# Patient Record
Sex: Male | Born: 1985
Health system: Southern US, Community
[De-identification: ages and names within clinical notes are randomized; demographics above are authoritative.]

## PROBLEM LIST (undated history)

## (undated) DIAGNOSIS — Z8619 Personal history of other infectious and parasitic diseases: Secondary | ICD-10-CM

## (undated) HISTORY — DX: Personal history of other infectious and parasitic diseases: Z86.19

---
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
## 2009-06-15 ENCOUNTER — Encounter: Admission: RE | Admit: 2009-06-15 | Discharge: 2009-06-15 | Payer: Self-pay | Admitting: Pulmonary Disease

## 2010-02-25 ENCOUNTER — Encounter: Admission: RE | Admit: 2010-02-25 | Discharge: 2010-02-25 | Payer: Self-pay | Admitting: Infectious Diseases

## 2012-12-17 ENCOUNTER — Ambulatory Visit
Admission: RE | Admit: 2012-12-17 | Discharge: 2012-12-17 | Disposition: A | Discharge status: Home / Self Care | Payer: No Typology Code available for payment source | Source: Ambulatory Visit | Attending: Occupational Medicine | Admitting: Occupational Medicine

## 2012-12-17 ENCOUNTER — Other Ambulatory Visit: Payer: Self-pay | Admitting: Occupational Medicine

## 2012-12-17 DIAGNOSIS — Z021 Encounter for pre-employment examination: Secondary | ICD-10-CM

## 2013-09-09 ENCOUNTER — Other Ambulatory Visit: Payer: Self-pay | Admitting: Occupational Medicine

## 2013-09-09 ENCOUNTER — Ambulatory Visit
Admission: RE | Admit: 2013-09-09 | Discharge: 2013-09-09 | Disposition: A | Discharge status: Home / Self Care | Payer: Worker's Compensation | Source: Ambulatory Visit | Attending: Occupational Medicine | Admitting: Occupational Medicine

## 2013-09-09 DIAGNOSIS — M25512 Pain in left shoulder: Secondary | ICD-10-CM

## 2019-03-10 ENCOUNTER — Telehealth: Payer: Self-pay | Admitting: Physician Assistant

## 2019-03-10 ENCOUNTER — Other Ambulatory Visit: Payer: Self-pay

## 2019-03-10 ENCOUNTER — Encounter: Payer: Self-pay | Admitting: Physician Assistant

## 2019-03-10 ENCOUNTER — Ambulatory Visit (INDEPENDENT_AMBULATORY_CARE_PROVIDER_SITE_OTHER): Payer: 59 | Admitting: Physician Assistant

## 2019-03-10 DIAGNOSIS — R1032 Left lower quadrant pain: Secondary | ICD-10-CM | POA: Diagnosis not present

## 2019-03-10 NOTE — Telephone Encounter (Signed)
Please call and schedule New Pt appt for today with [facility].

## 2019-03-10 NOTE — Telephone Encounter (Signed)
Pt is requesting NP appt. His wife sees [place] as well. Pt stated he has been having "dull pain in lower left abdom. " since Friday. No F/C/C per pt. Please advise if pt can come in office or do virtual.

## 2019-03-10 NOTE — Progress Notes (Signed)
Virtual Visit via Video   I connected with Todd Key on 03/10/19 at 11:20 AM EDT by a video enabled telemedicine application and verified that I am speaking with the correct person using two identifiers. Location patient: Home Location provider: [facility], Office Persons participating in the virtual visit: Todd BodyGradon Sgroi, Jarold MottoSamantha Niamh Rada, PA, Jarold MottoSamantha Keelee Yankey, PA-C.   I discussed the limitations of evaluation and management by telemedicine and the availability of in person appointments. The patient expressed understanding and agreed to proceed.  Subjective:   HPI:   Establish care -- Pt is a new patient today and establishing with our office.  LLQ pain Pt c/o LLQ dull pain, no radiation, started on Saturday. At its worse its 6/10. No fever, nausea, vomiting. Has had slightly decreased appetite. Pt states pain is worse when bladder is full and has to move bowels and after he goes pain subsides but does not go away completely. Pt denies diarrhea or constipation. Pt taking Tylenol 1000 mg with some relief. He does state that he has occasional rectal bleeding after straining too hard. States that he had slight blood on his toilet tissue when wiping on Thursday or Friday prior to onset of symptoms -- this is not completely uncommon for him.  Denies dysuria, hx of kidney stone, back pain, rectal pain, abdominal pain with movement, unintentional weight loss. Denies lactose intolerance. Blood pressure 130/80's when he checks it.  ROS: See pertinent positives and negatives per HPI.  There are no active problems to display for this patient.   Social History   Tobacco Use  . Smoking status: Never Smoker  . Smokeless tobacco: Never Used  Substance Use Topics  . Alcohol use: Yes    Alcohol/week: 3.0 standard drinks    Types: 3 Standard drinks or equivalent per week   No current outpatient medications on file.  Allergies  Allergen Reactions  . Oxycodone Nausea And Vomiting  .  Penicillins Rash    Objective:   VITALS: Per patient if applicable, see vitals. GENERAL: Alert, appears well and in no acute distress. HEENT: Atraumatic, conjunctiva clear, no obvious abnormalities on inspection of external nose and ears. NECK: Normal movements of the head and neck. CARDIOPULMONARY: No increased WOB. Speaking in clear sentences. I:E ratio WNL.  MS: Moves all visible extremities without noticeable abnormality. PSYCH: Pleasant and cooperative, well-groomed. Speech normal rate and rhythm. Affect is appropriate. Insight and judgement are appropriate. Attention is focused, linear, and appropriate.  NEURO: CN grossly intact. Oriented as arrived to appointment on time with no prompting. Moves both UE equally.  SKIN: No obvious lesions, wounds, erythema, or cyanosis noted on face or hands.  Assessment and Plan:   [name] was seen today for establish care and abdominal pain.  Diagnoses and all orders for this visit:  Left lower quadrant abdominal pain   No red flags on history. Denies fever, cough, chills -- will have patient come in KUB and UA. Suspect constipation, however cannot rule out: diverticulitis, muscle strain, kidney stone, colitis. Worsening precautions advised. Further work-up to be guided by KUB and UA results, which he is coming into the office tomorrow to have performed.   . Reviewed expectations re: course of current medical issues. . Discussed self-management of symptoms. . Outlined signs and symptoms indicating need for more acute intervention. . Patient verbalized understanding and all questions were answered. [name]. Health Maintenance issues including appropriate healthy diet, exercise, and smoking avoidance were discussed with patient. . See orders for this visit as  documented in the electronic medical record.  I discussed the assessment and treatment plan with the patient. The patient was provided an opportunity to ask questions and all were answered. The  patient agreed with the plan and demonstrated an understanding of the instructions.   The patient was advised to call back or seek an in-person evaluation if the symptoms worsen or if the condition fails to improve as anticipated.    [place], [place] 03/10/2019

## 2019-03-11 ENCOUNTER — Other Ambulatory Visit (INDEPENDENT_AMBULATORY_CARE_PROVIDER_SITE_OTHER): Payer: 59

## 2019-03-11 ENCOUNTER — Other Ambulatory Visit: Payer: Self-pay

## 2019-03-11 ENCOUNTER — Ambulatory Visit (INDEPENDENT_AMBULATORY_CARE_PROVIDER_SITE_OTHER): Payer: 59

## 2019-03-11 DIAGNOSIS — R1032 Left lower quadrant pain: Secondary | ICD-10-CM

## 2019-03-11 LAB — POC URINALSYSI DIPSTICK (AUTOMATED)
Bilirubin, UA: NEGATIVE
Blood, UA: NEGATIVE
Glucose, UA: NEGATIVE
Ketones, UA: NEGATIVE
Leukocytes, UA: NEGATIVE
Nitrite, UA: NEGATIVE
Protein, UA: NEGATIVE
Spec Grav, UA: 1.01 (ref 1.010–1.025)
Urobilinogen, UA: 0.2 E.U./dL
pH, UA: 6 (ref 5.0–8.0)

## 2019-04-29 ENCOUNTER — Encounter: Payer: Self-pay | Admitting: Physician Assistant

## 2019-04-29 ENCOUNTER — Telehealth: Payer: Self-pay | Admitting: Physical Therapy

## 2019-04-29 NOTE — Telephone Encounter (Signed)
Copied from [place] [id number]. Topic: Quick Communication - See Telephone Encounter >> Apr 28, 2019  5:34 PM Loma Boston wrote: CRM for notification. See Telephone encounter for: 04/28/19. Pls reach out to pt. This is after hous. He is returning a call from office canceling his appt since it had not been 10 days since COVID symptoms. He wants to resch and really just needs blood drawn and a cancer screening. FU at # [phone]

## 2019-04-29 NOTE — Telephone Encounter (Signed)
Spoke to pt, scheduled appt for 6/30 wit Samantha.

## 2019-05-07 ENCOUNTER — Encounter: Payer: Self-pay | Admitting: Physician Assistant

## 2019-05-13 ENCOUNTER — Other Ambulatory Visit: Payer: Self-pay

## 2019-05-13 ENCOUNTER — Encounter: Payer: Self-pay | Admitting: Physician Assistant

## 2019-05-13 ENCOUNTER — Ambulatory Visit: Payer: 59 | Admitting: Physician Assistant

## 2019-05-13 VITALS — BP 122/64 | HR 65 | Temp 98.7°F | Ht 73.0 in | Wt 232.0 lb

## 2019-05-13 DIAGNOSIS — Z13220 Encounter for screening for lipoid disorders: Secondary | ICD-10-CM

## 2019-05-13 DIAGNOSIS — K625 Hemorrhage of anus and rectum: Secondary | ICD-10-CM | POA: Diagnosis not present

## 2019-05-13 DIAGNOSIS — E669 Obesity, unspecified: Secondary | ICD-10-CM

## 2019-05-13 DIAGNOSIS — Z129 Encounter for screening for malignant neoplasm, site unspecified: Secondary | ICD-10-CM

## 2019-05-13 DIAGNOSIS — Z136 Encounter for screening for cardiovascular disorders: Secondary | ICD-10-CM

## 2019-05-13 DIAGNOSIS — Z0001 Encounter for general adult medical examination with abnormal findings: Secondary | ICD-10-CM

## 2019-05-13 LAB — CBC WITH DIFFERENTIAL/PLATELET
Basophils Absolute: 0 10*3/uL (ref 0.0–0.1)
Basophils Relative: 0.5 % (ref 0.0–3.0)
Eosinophils Absolute: 0.1 10*3/uL (ref 0.0–0.7)
Eosinophils Relative: 0.9 % (ref 0.0–5.0)
HCT: 48.6 % (ref 39.0–52.0)
Hemoglobin: 16.7 g/dL (ref 13.0–17.0)
Lymphocytes Relative: 36 % (ref 12.0–46.0)
Lymphs Abs: 2.2 10*3/uL (ref 0.7–4.0)
MCHC: 34.3 g/dL (ref 30.0–36.0)
MCV: 84.7 fl (ref 78.0–100.0)
Monocytes Absolute: 0.4 10*3/uL (ref 0.1–1.0)
Monocytes Relative: 7 % (ref 3.0–12.0)
Neutro Abs: 3.4 10*3/uL (ref 1.4–7.7)
Neutrophils Relative %: 55.6 % (ref 43.0–77.0)
Platelets: 221 10*3/uL (ref 150.0–400.0)
RBC: 5.74 Mil/uL (ref 4.22–5.81)
RDW: 13.8 % (ref 11.5–15.5)
WBC: 6.1 10*3/uL (ref 4.0–10.5)

## 2019-05-13 LAB — LIPID PANEL
Cholesterol: 166 mg/dL (ref 0–200)
HDL: 39.5 mg/dL (ref >39.00)
LDL Cholesterol: 107 mg/dL — ABNORMAL HIGH (ref 0–99)
NonHDL: 126.67
Total CHOL/HDL Ratio: 4
Triglycerides: 97 mg/dL (ref 0.0–149.0)
VLDL: 19.4 mg/dL (ref 0.0–40.0)

## 2019-05-13 LAB — COMPREHENSIVE METABOLIC PANEL
ALT: 17 U/L (ref 0–53)
AST: 14 U/L (ref 0–37)
Albumin: 4.6 g/dL (ref 3.5–5.2)
Alkaline Phosphatase: 72 U/L (ref 39–117)
BUN: 14 mg/dL (ref 6–23)
CO2: 29 mEq/L (ref 19–32)
Calcium: 9.3 mg/dL (ref 8.4–10.5)
Chloride: 101 mEq/L (ref 96–112)
Creatinine, Ser: 1.01 mg/dL (ref 0.40–1.50)
GFR: 85.75 mL/min (ref >60.00)
Glucose, Bld: 69 mg/dL — ABNORMAL LOW (ref 70–99)
Potassium: 4.3 mEq/L (ref 3.5–5.1)
Sodium: 139 mEq/L (ref 135–145)
Total Bilirubin: 1.4 mg/dL — ABNORMAL HIGH (ref 0.2–1.2)
Total Protein: 6.6 g/dL (ref 6.0–8.3)

## 2019-05-13 NOTE — Progress Notes (Signed)
Subjective:    Todd Key is a 33 y.o. male and is here for a comprehensive physical exam.  HPI  There are no preventive care reminders to display for this patient.  Acute Concerns: Encounter for cancer screening -- patient reports that he is concerned that he is high-risk for developing cancer. 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 He has MGF with stomach CA, PGF with lung CA, and paternal aunt with early death at age 32 -- had "litany of health issues" Rectal bleeding -- has history of rectal bleeding intermittently over the past few years. Blood when wiping only. Last happened about two years ago. Denies unusual abdominal pain, unintentional weight loss, changes in bowels.  Chronic Issues: Obesity -- has lost about 10 lb (intentionally) with intermittent fasting. Exercises when he has time.   Health Maintenance: Immunizations -- UTD Colonoscopy -- n/a PSA -- denies family history Diet -- intermittent fasting, eats all food groups Caffeine intake -- none Sleep habits -- sometimes is a night owl Exercise -- as able Weight -- Weight: 232 lb (105.2 kg)  Weight history Wt Readings from Last 10 Encounters:  05/13/19 232 lb (105.2 kg)  Mood -- good Tobacco use -- none Alcohol use --- very minimal  [facility]  Decreased Interest 0  Down, Depressed, Hopeless 0  PHQ - 2 Score 0     Other providers/specialists: Patient Care Team: Todd Key, Todd Key, [place]PA as PCP - General (Physician Assistant)   PMHx, SurgHx, SocialHx, Medications, and Allergies were reviewed in the Visit Navigator and updated as appropriate.   Past Medical History:  Diagnosis Date  . History of chicken pox      Past Surgical History:  Procedure Laterality Date  . wisdom teeth Bilateral 2005     Family History  Problem Relation Age of Onset  . Alzheimer's disease Maternal Grandmother   . Stomach cancer  Maternal Grandfather   . Heart failure Maternal Grandfather   . Lung cancer Paternal Grandfather     Social History   Tobacco Use  . Smoking status: Never Smoker  . Smokeless tobacco: Never Used  Substance Use Topics  . Alcohol use: Yes    Alcohol/week: 3.0 standard drinks    Types: 3 Standard drinks or equivalent per week  . Drug use: Never    Review of Systems:   Review of Systems  Constitutional: Negative for chills, fever, malaise/fatigue and weight loss.  HENT: Negative for hearing loss, sinus pain and sore throat.   Respiratory: Negative for cough and hemoptysis.   Cardiovascular: Negative for chest pain, palpitations, leg swelling and PND.  Gastrointestinal: Positive for blood in stool. Negative for abdominal pain, constipation, diarrhea, heartburn, nausea and vomiting.  Genitourinary: Negative for dysuria, frequency and urgency.  Musculoskeletal: Negative for back pain, myalgias and neck pain.  Skin: Negative for itching and rash.  Neurological: Negative for dizziness, tingling, seizures and headaches.  Endo/Heme/Allergies: Negative for polydipsia.  Psychiatric/Behavioral: Negative for depression. The patient is not nervous/anxious.     Objective:   Vitals:   05/13/19 0918  BP: 122/64  Pulse: 65  Temp: 98.7 F (37.1 C)  SpO2: 99%   Body mass index is 30.61 kg/m.  General Appearance:  Alert, cooperative, no distress, appears stated age  Head:  Normocephalic, without obvious abnormality, atraumatic  Eyes:  PERRL, conjunctiva/corneas clear, EOM's intact, fundi benign, both eyes  Ears:  Normal TM's and external ear canals, both ears  Nose: Nares normal, septum midline, mucosa normal, no drainage    or sinus tenderness  Throat: Lips, mucosa, and tongue normal; teeth and gums normal  Neck: Supple, symmetrical, trachea midline, no adenopathy; thyroid:  No enlargement/tenderness/nodules; no carotit bruit or JVD  Back:   Symmetric, no curvature, ROM normal, no  CVA tenderness  Lungs:   Clear to auscultation bilaterally, respirations unlabored  Chest wall:  No tenderness or deformity  Heart:  Regular rate and rhythm, S1 and S2 normal, no murmur, rub   or gallop  Abdomen:   Soft, non-tender, bowel sounds active all four quadrants, no masses, no organomegaly  Extremities: Extremities normal, atraumatic, no cyanosis or edema  Prostate: Not done.   Skin: Skin color, texture, turgor normal, no rashes or lesions  Lymph nodes: Cervical, supraclavicular, and axillary nodes normal  Neurologic: CNII-XII grossly intact. Normal strength, sensation and reflexes throughout    Assessment/Plan:   Todd Key was seen today for requesting labs for cancer screening.  Diagnoses and all orders for this visit:  Encounter for general adult medical examination with abnormal findings Today patient counseled on age appropriate routine health concerns for screening and prevention, each reviewed and up to date or declined. Immunizations reviewed and up to date or declined. Labs ordered and reviewed. Risk factors for depression reviewed and negative. Hearing function and visual acuity are intact. ADLs screened and addressed as needed. Functional ability and level of safety reviewed and appropriate. Education, counseling and referrals performed based on assessed risks today. Patient provided with a copy of personalized plan for preventive services.  Rectal bleeding Discussed that it is likely from hemorrhoids, however cannot r/o other causes. Advised on GI referral, he would like to think about this and will let us know.  Obesity, unspecified classification, unspecified obesity type, unspecified whether serious comorbidity present Doing well with intermittent fasting. Commended success, continue efforts. -     CBC with Differential/Platelet -     Comprehensive metabolic panel  Encounter for lipid screening for cardiovascular disease -     Lipid panel  Encounter for cancer  screening Discussed extensively. I discussed possible consideration of referral to the [employer], however he would like to discuss this with his wife and then he will let us know.  Well Adult Exam: Labs ordered: Yes. Patient counseling was done. See below for items discussed. Discussed the patient's BMI.  The BMI is not in the acceptable range; BMI management plan is completed Follow up in one year.  Patient Counseling: [x]   Nutrition: Stressed importance of moderation in sodium/caffeine intake, saturated fat and cholesterol, caloric balance, sufficient intake of fresh fruits, vegetables, and fiber.  [x]   Stressed the importance of regular exercise.   []   Substance Abuse: Discussed cessation/primary prevention of tobacco, alcohol, or other drug use; driving or other dangerous activities under the influence; availability of treatment for abuse.   [x]   Injury prevention: Discussed safety belts, safety helmets, smoke detector, smoking near bedding or upholstery.   []   Sexuality: Discussed sexually transmitted diseases, partner selection, use of condoms, avoidance of unintended pregnancy  and contraceptive alternatives.   [x]   Dental health: Discussed importance of regular tooth brushing, flossing, and dental visits.  [x]   Health maintenance and immunizations reviewed. Please refer to Health maintenance section.     Inda Coke, PA-C [place]

## 2019-05-13 NOTE — Patient Instructions (Signed)
It was great to see you!  Please go to the lab for blood work.   Things to think about: 1. Referral to gastroenterologist for rectal bleeding 2. Referral to dermatology for skin checks (some insurances don't require referrals) 3. Referral to Genetic Counselor  Our office will call you with your results unless you have chosen to receive results via MyChart.  If your blood work is normal we will follow-up each year for physicals and as scheduled for chronic medical problems.  If anything is abnormal we will treat accordingly and get you in for a follow-up.  Take care,  [facility] Maintenance, Male Adopting a healthy lifestyle and getting preventive care are important in promoting health and wellness. Ask your health care provider about:  The right schedule for you to have regular tests and exams.  Things you can do on your own to prevent diseases and keep yourself healthy. What should I know about diet, weight, and exercise? Eat a healthy diet   Eat a diet that includes plenty of vegetables, fruits, low-fat dairy products, and lean protein.  Do not eat a lot of foods that are high in solid fats, added sugars, or sodium. Maintain a healthy weight Body mass index (BMI) is a measurement that can be used to identify possible weight problems. It estimates body fat based on height and weight. Your health care provider can help determine your BMI and help you achieve or maintain a healthy weight. Get regular exercise Get regular exercise. This is one of the most important things you can do for your health. Most adults should:  Exercise for at least 150 minutes each week. The exercise should increase your heart rate and make you sweat (moderate-intensity exercise).  Do strengthening exercises at least twice a week. This is in addition to the moderate-intensity exercise.  Spend less time sitting. Even light physical activity can be beneficial. Watch cholesterol and blood  lipids Have your blood tested for lipids and cholesterol at 32 years of age, then have this test every 5 years. You may need to have your cholesterol levels checked more often if:  Your lipid or cholesterol levels are high.  You are older than 32 years of age.  You are at high risk for heart disease. What should I know about cancer screening? Many types of cancers can be detected early and may often be prevented. Depending on your health history and family history, you may need to have cancer screening at various ages. This may include screening for:  Colorectal cancer.  Prostate cancer.  Skin cancer.  Lung cancer. What should I know about heart disease, diabetes, and high blood pressure? Blood pressure and heart disease  High blood pressure causes heart disease and increases the risk of stroke. This is more likely to develop in people who have high blood pressure readings, are of African descent, or are overweight.  Talk with your health care provider about your target blood pressure readings.  Have your blood pressure checked: ? Every 3-5 years if you are 78-24 years of age. ? Every year if you are 81 years old or older.  If you are between the ages of 36 and 76 and are a current or former smoker, ask your health care provider if you should have a one-time screening for abdominal aortic aneurysm (AAA). Diabetes Have regular diabetes screenings. This checks your fasting blood sugar level. Have the screening done:  Once every three years after age 25 if you  are at a normal weight and have a low risk for diabetes.  More often and at a younger age if you are overweight or have a high risk for diabetes. What should I know about preventing infection? Hepatitis B If you have a higher risk for hepatitis B, you should be screened for this virus. Talk with your health care provider to find out if you are at risk for hepatitis B infection. Hepatitis C Blood testing is recommended for:   Everyone born from [birth year] through 1964.  Anyone with known risk factors for hepatitis C. Sexually transmitted infections (STIs)  You should be screened each year for STIs, including gonorrhea and chlamydia, if: ? You are sexually active and are younger than 32 years of age. ? You are older than 32 years of age and your health care provider tells you that you are at risk for this type of infection. ? Your sexual activity has changed since you were last screened, and you are at increased risk for chlamydia or gonorrhea. Ask your health care provider if you are at risk.  Ask your health care provider about whether you are at high risk for HIV. Your health care provider may recommend a prescription medicine to help prevent HIV infection. If you choose to take medicine to prevent HIV, you should first get tested for HIV. You should then be tested every 3 months for as long as you are taking the medicine. Follow these instructions at home: Lifestyle  Do not use any products that contain nicotine or tobacco, such as cigarettes, e-cigarettes, and chewing tobacco. If you need help quitting, ask your health care provider.  Do not use street drugs.  Do not share needles.  Ask your health care provider for help if you need support or information about quitting drugs. Alcohol use  Do not drink alcohol if your health care provider tells you not to drink.  If you drink alcohol: ? Limit how much you have to 0-2 drinks a day. ? Be aware of how much alcohol is in your drink. In the [place] one drink equals one 12 oz bottle of beer (355 mL), one 5 oz glass of wine (148 mL), or one 1 oz glass of hard liquor (44 mL). General instructions  Schedule regular health, dental, and eye exams.  Stay current with your vaccines.  Tell your health care provider if: ? You often feel depressed. ? You have ever been abused or do not feel safe at home. Summary  Adopting a healthy lifestyle and getting preventive  care are important in promoting health and wellness.  Follow your health care provider's instructions about healthy diet, exercising, and getting tested or screened for diseases.  Follow your health care provider's instructions on monitoring your cholesterol and blood pressure. This information is not intended to replace advice given to you by your health care provider. Make sure you discuss any questions you have with your health care provider. Document Released: 04/27/2008 Document Revised: 10/23/2018 Document Reviewed: 10/23/2018 Elsevier Patient Education  2020 [employer]

## 2019-05-15 ENCOUNTER — Telehealth: Payer: Self-pay | Admitting: Physician Assistant

## 2019-05-15 DIAGNOSIS — R17 Unspecified jaundice: Secondary | ICD-10-CM

## 2019-05-15 NOTE — Telephone Encounter (Signed)
Patient is calling back regarding lab results. Please advise. Thank you

## 2019-05-19 NOTE — Telephone Encounter (Signed)
Pt calling back and would like a call back the nurse. Please advise   Copied from [place] [id number]. Topic: Quick Communication - Lab Results (Clinic Use ONLY) >> May 15, 2019  5:37 PM Zellmer, Clearnce Sorrel, CMA wrote: Called patient to inform them of 6/30 lab results. When patient returns call, triage nurse may disclose results.

## 2019-05-19 NOTE — Telephone Encounter (Signed)
See note

## 2019-05-20 NOTE — Telephone Encounter (Signed)
Lab orders are placed in Epic.

## 2019-05-20 NOTE — Telephone Encounter (Signed)
Please schedule lab appointment.

## 2019-05-20 NOTE — Telephone Encounter (Signed)
No lab orders in

## 2019-05-20 NOTE — Telephone Encounter (Signed)
Pt given results and documented in result note. Pt will need to be scheduled for recheck of labs in one month per notes of Samantha,PA on 05/14/19.

## 2019-05-22 ENCOUNTER — Other Ambulatory Visit: Payer: Self-pay

## 2019-05-22 ENCOUNTER — Other Ambulatory Visit (INDEPENDENT_AMBULATORY_CARE_PROVIDER_SITE_OTHER): Payer: 59

## 2019-05-22 DIAGNOSIS — R17 Unspecified jaundice: Secondary | ICD-10-CM | POA: Diagnosis not present

## 2019-05-22 LAB — HEPATIC FUNCTION PANEL
ALT: 17 U/L (ref 0–53)
AST: 14 U/L (ref 0–37)
Albumin: 4.6 g/dL (ref 3.5–5.2)
Alkaline Phosphatase: 71 U/L (ref 39–117)
Bilirubin, Direct: 0.2 mg/dL (ref 0.0–0.3)
Total Bilirubin: 1.3 mg/dL — ABNORMAL HIGH (ref 0.2–1.2)
Total Protein: 6.5 g/dL (ref 6.0–8.3)

## 2019-07-22 ENCOUNTER — Encounter: Payer: Self-pay | Admitting: Physician Assistant

## 2019-07-22 NOTE — Telephone Encounter (Signed)
Todd Key I do not see a referral.

## 2020-01-05 ENCOUNTER — Encounter: Payer: Self-pay | Admitting: Physician Assistant

## 2020-01-09 ENCOUNTER — Encounter: Payer: Self-pay | Admitting: Physician Assistant

## 2020-01-09 ENCOUNTER — Ambulatory Visit (INDEPENDENT_AMBULATORY_CARE_PROVIDER_SITE_OTHER): Payer: 59

## 2020-01-09 ENCOUNTER — Ambulatory Visit (INDEPENDENT_AMBULATORY_CARE_PROVIDER_SITE_OTHER): Payer: 59 | Admitting: Physician Assistant

## 2020-01-09 ENCOUNTER — Other Ambulatory Visit: Payer: Self-pay

## 2020-01-09 VITALS — BP 110/70 | HR 72 | Temp 98.0°F | Ht 73.0 in | Wt 236.5 lb

## 2020-01-09 DIAGNOSIS — M79622 Pain in left upper arm: Secondary | ICD-10-CM

## 2020-01-09 NOTE — Patient Instructions (Signed)
It was great to see you!  We will be in touch as soon as xrays have returned.  If you develop any new sudden onset weakness or other concerns, please seek urgent care.  A referral has been placed for you to see Dr. Clementeen Graham with [facility]. Someone from there office will be in touch soon regarding your appointment with him. His location: Psychiatrist Medicine at [facility] [address] on the 1st floor.   Phone number [phone], Fax [phone].  This location is across the street from the entrance to [employer] and in the same complex as the [facility] and [employer] bank  Take care,  Jarold Motto PA-C

## 2020-01-09 NOTE — Progress Notes (Signed)
Todd Key is a 34 y.o. male here for a new problem.  I acted as a Education administrator for [employer], PA-C Todd Pickler, LPN  History of Present Illness:   Chief Complaint  Patient presents with  . axilla pain    HPI   Axilla pain Pt c/o left axilla pain, started a couple weeks ago. Pain is off and on, has sharp pain when he brings his arm close to his body. Occurs about twice a day and varies in intensity, feels like it may happen more frequently at night.  Lasts 15-45 seconds. Rates it 6/10. Denies: numbness, tingling, weakness, prior significant neck/back injury (was active in wrestling several years ago.)  Had two people in his life recently pass away, does have this on his mind recently.   Past Medical History:  Diagnosis Date  . History of chicken pox      Social History   Socioeconomic History  . Marital status: Unknown    Spouse name: Not on file  . Number of children: Not on file  . Years of education: Not on file  . Highest education level: Not on file  Occupational History  . Not on file  Tobacco Use  . Smoking status: Never Smoker  . Smokeless tobacco: Never Used  Substance and Sexual Activity  . Alcohol use: Yes    Alcohol/week: 3.0 standard drinks    Types: 3 Standard drinks or equivalent per week  . Drug use: Never  . Sexual activity: Yes  Other Topics Concern  . Not on file  Social History Narrative   Airline pilot   Social Determinants of Health   Financial Resource Strain:   . Difficulty of Paying Living Expenses: Not on file  Food Insecurity:   . Worried About Charity fundraiser in the Last Year: Not on file  . Ran Out of Food in the Last Year: Not on file  Transportation Needs:   . Lack of Transportation (Medical): Not on file  . Lack of Transportation (Non-Medical): Not on file  Physical Activity:   . Days of Exercise per Week: Not on file  . Minutes of Exercise per Session: Not on file  Stress:   . Feeling of Stress : Not on file   Social Connections:   . Frequency of Communication with Friends and Family: Not on file  . Frequency of Social Gatherings with Friends and Family: Not on file  . Attends Religious Services: Not on file  . Active Member of Clubs or Organizations: Not on file  . Attends Archivist Meetings: Not on file  . Marital Status: Not on file  Intimate Partner Violence:   . Fear of Current or Ex-Partner: Not on file  . Emotionally Abused: Not on file  . Physically Abused: Not on file  . Sexually Abused: Not on file    Past Surgical History:  Procedure Laterality Date  . wisdom teeth Bilateral 2005    Family History  Problem Relation Age of Onset  . Alzheimer's disease Maternal Grandmother   . Stomach cancer Maternal Grandfather   . Heart failure Maternal Grandfather   . Lung cancer Paternal Grandfather     Allergies  Allergen Reactions  . Oxycodone Nausea And Vomiting  . Penicillins Rash    Current Medications:  No current outpatient medications on file.   Review of Systems:   ROS  Negative unless otherwise specified per HPI.  Vitals:   Vitals:   01/09/20 1023  BP: 110/70  Pulse:  72  Temp: 98 F (36.7 C)  TempSrc: Temporal  SpO2: 95%  Weight: 236 lb 8 oz (107.3 kg)  Height: 6\' 1"  (1.854 m)     Body mass index is 31.2 kg/m.  Physical Exam:   Physical Exam Vitals and nursing note reviewed.  Constitutional:      General: He is not in acute distress.    Appearance: He is well-developed. He is not ill-appearing or toxic-appearing.  HENT:     Head: Normocephalic.  Eyes:     Conjunctiva/sclera: Conjunctivae normal.     Pupils: Pupils are equal, round, and reactive to light.  Pulmonary:     Effort: Pulmonary effort is normal.     Breath sounds: Normal breath sounds.  Musculoskeletal:        General: Normal range of motion.     Cervical back: Normal range of motion.     Comments: Shoulder exam: No deformity of shoulders on inspection. No bony  tenderness of cervical/thoracic spine or paraspinal muscles. No pain with palpation of shoulder landmarks. FROM in abduction and forward flexion. No pain or weakness with testing SITS in ext/int rotation. No pain with empty can sign. No pain with crossover test.  Lymphadenopathy:     Upper Body:     Right upper body: No axillary adenopathy.     Left upper body: No axillary adenopathy.  Skin:    General: Skin is warm and dry.  Neurological:     Mental Status: He is alert and oriented to person, place, and time.     GCS: GCS eye subscore is 4. GCS verbal subscore is 5. GCS motor subscore is 6.  Psychiatric:        Speech: Speech normal.        Behavior: Behavior normal. Behavior is cooperative.        Thought Content: Thought content normal.        Judgment: Judgment normal.     Assessment and Plan:   Todd Key was seen today for axilla pain.  Diagnoses and all orders for this visit:  Left axillary pain -     DG Cervical Spine Complete; Future -     DG Thoracic Spine W/Swimmers; Future   Unclear etiology. Unable to reproduce symptoms on exam. Will obtain cervical and thoracic spine images to rule out acute bony process. Offered gabapentin but patient declined, symptoms are not severe or bothersome enough to warrant medication at this time. Referral to Dr. Karenann Key for further evaluation and intervention. Patient agreeable to plan.  . Reviewed expectations re: course of current medical issues. . Discussed self-management of symptoms. . Outlined signs and symptoms indicating need for more acute intervention. . Patient verbalized understanding and all questions were answered. . See orders for this visit as documented in the electronic medical record. . Patient received an After-Visit Summary.  CMA or LPN served as scribe during this visit. History, Physical, and Plan performed by medical provider. The above documentation has been reviewed and is accurate and complete.  Todd Amass, PA-C

## 2020-01-14 ENCOUNTER — Encounter: Payer: Self-pay | Admitting: Family Medicine

## 2020-01-14 ENCOUNTER — Ambulatory Visit: Payer: 59 | Admitting: Family Medicine

## 2020-01-14 ENCOUNTER — Other Ambulatory Visit: Payer: Self-pay

## 2020-01-14 VITALS — BP 110/74 | HR 85 | Ht 73.0 in | Wt 236.4 lb

## 2020-01-14 DIAGNOSIS — M79622 Pain in left upper arm: Secondary | ICD-10-CM

## 2020-01-14 NOTE — Progress Notes (Signed)
Subjective:    I'm seeing this patient as a consultation for:  Len Blalock, [place]. Note will be routed back to referring provider/PCP.  CC: L axillary pain  I, Molly Weber, LAT, ATC, am serving as scribe for Dr. Lynne Leader.  HPI: Pt is a 34 y/o male presenting w/ c/o L axillary pain that happens, on average, 2x/day and lasts sometime between 15-45 sec each time.  He rates his pain at a 6/10 and describes his pain as sharp.  He states that he has not had this pain in the last 4-5 days.  Radiating pain: No Swelling: No Numbness/tingling: No Weakness: No Aggravating factors: contact/pressure to the area Treatments tried: No  Diagnostic imaging: C-spine and T-spine XR- 01/09/20  Past medical history, Surgical history, Family history, Social history, Allergies, and medications have been entered into the medical record, reviewed.   Review of Systems: No new headache, visual changes, nausea, vomiting, diarrhea, constipation, dizziness, abdominal pain, skin rash, fevers, chills, night sweats, weight loss, swollen lymph nodes, body aches, joint swelling, muscle aches, chest pain, shortness of breath, mood changes, visual or auditory hallucinations.   Objective:    Vitals:   01/14/20 0837  BP: 110/74  Pulse: 85  SpO2: 96%   General: Well Developed, well nourished, and in no acute distress.  Neuro/Psych: Alert and oriented x3, extra-ocular muscles intact, able to move all 4 extremities, sensation grossly intact. Skin: Warm and dry, no rashes noted.  Respiratory: Not using accessory muscles, speaking in full sentences, trachea midline.  Clear to auscultation bilaterally. Cardiovascular: Pulses palpable, no extremity edema.  Regular rate and rhythm no murmurs rubs or gallops. Abdomen: Does not appear distended. MSK: Left axilla normal-appearing nontender no masses palpated. Normal shoulder and trunk motion. Normal strength.  Lab and Radiology Results DG Cervical Spine Complete  Result  Date: 01/09/2020 CLINICAL DATA:  Cervicalgia with left sided radicular symptoms EXAM: CERVICAL SPINE - COMPLETE 4+ VIEW COMPARISON:  None. FINDINGS: Frontal, lateral, open-mouth odontoid, and bilateral oblique views were obtained. There is no fracture or spondylolisthesis. Prevertebral soft tissues and predental space regions are normal. The disc spaces appear unremarkable. There is slight facet hypertrophy at C3-4 bilaterally, at C4-5 on the right, and at C5-6 on the left. No erosive change. Lung apices are clear. IMPRESSION: Mild facet hypertrophy at several levels. No appreciable disc space narrowing. No fracture or spondylolisthesis. Electronically Signed   By: Lowella Grip III M.D.   On: 01/09/2020 14:41   DG Thoracic Spine W/Swimmers  Result Date: 01/09/2020 CLINICAL DATA:  Dorsalgia EXAM: THORACIC SPINE - 3 VIEWS COMPARISON:  None. FINDINGS: Frontal, lateral, and swimmer's views were obtained. There is no fracture or spondylolisthesis. The disc spaces appear normal. No erosive change or paraspinous lesions. Visualized lungs clear. IMPRESSION: No fracture or spondylolisthesis.  No evident arthropathy. Electronically Signed   By: Lowella Grip III M.D.   On: 01/09/2020 14:40   I, Lynne Leader, personally (independently) visualized and performed the interpretation of the images attached in this note.   Impression and Recommendations:    Assessment and Plan: 34 y.o. male with left axillary pain.  Pain has spontaneously resolved.  Physical exam is normal today.  X-ray at C-spine T-spine largely normal however T1-T2 region not well visualized on either cervical or T-spine.  Differential could have been thoracic radiculopathy however this is less likely.  Plan for watchful waiting if symptoms return patient will notify me and we will proceed with further work-up.[name]   Discussed warning  signs or symptoms. Please see discharge instructions. Patient expresses understanding.   The above  documentation has been reviewed and is accurate and complete Clementeen Graham

## 2020-01-14 NOTE — Patient Instructions (Signed)
Thank you for coming in today. Lets keep an eye on this.  Recheck with me as needed.  Ok to send me a Clinical cytogeneticist message for non-urgent issues.

## 2021-02-07 IMAGING — DX DG CERVICAL SPINE COMPLETE 4+V
6 series · 6 of 6 positions shown · non-contrast
Comparison: None.

CLINICAL DATA: Cervicalgia with left sided radicular symptoms

EXAM:
CERVICAL SPINE - COMPLETE 4+ VIEW

[cervical spine lat (1 of 2)]
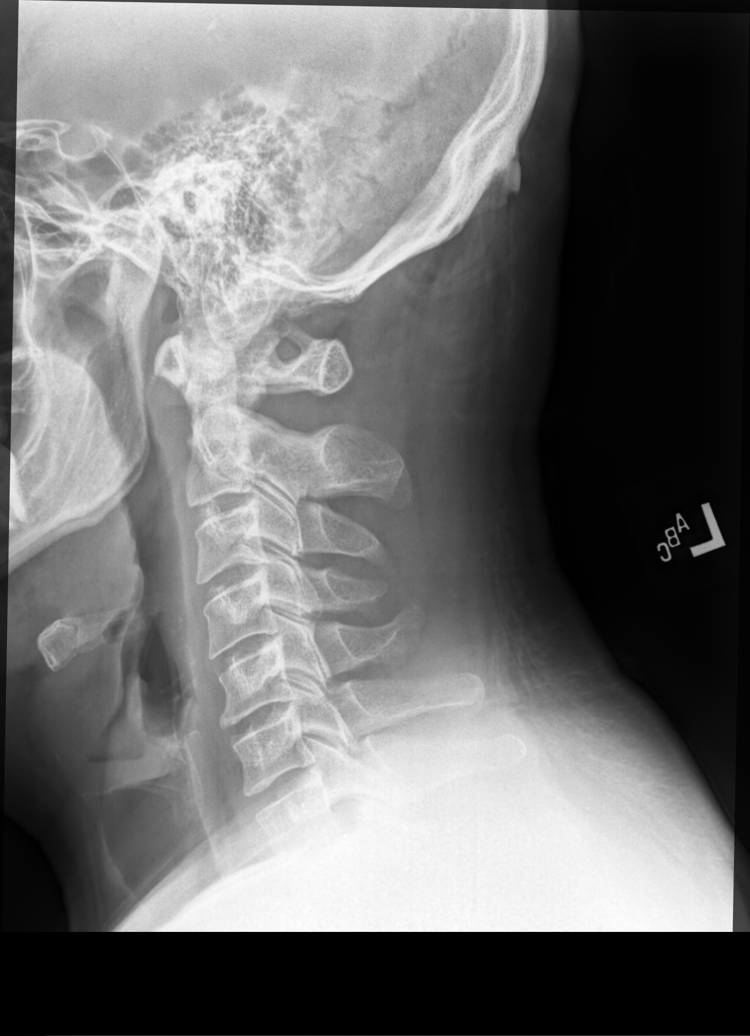

[cervical spine oblique (1 of 2)]
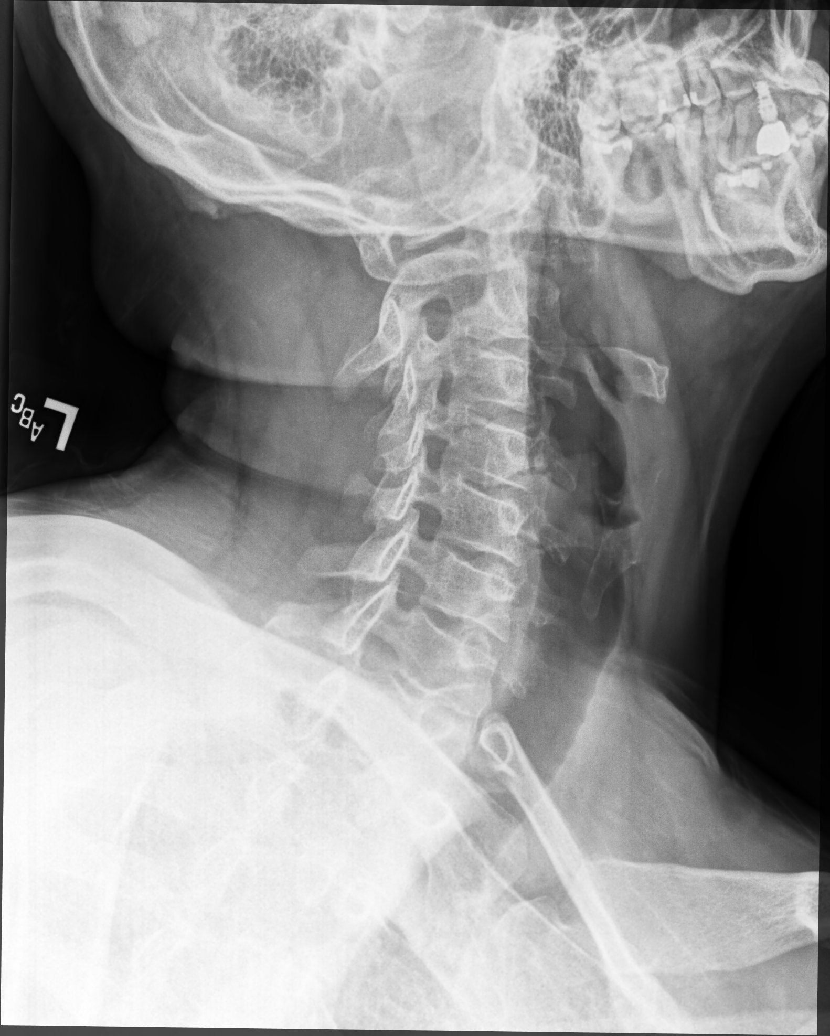

[cervical spine oblique (2 of 2)]
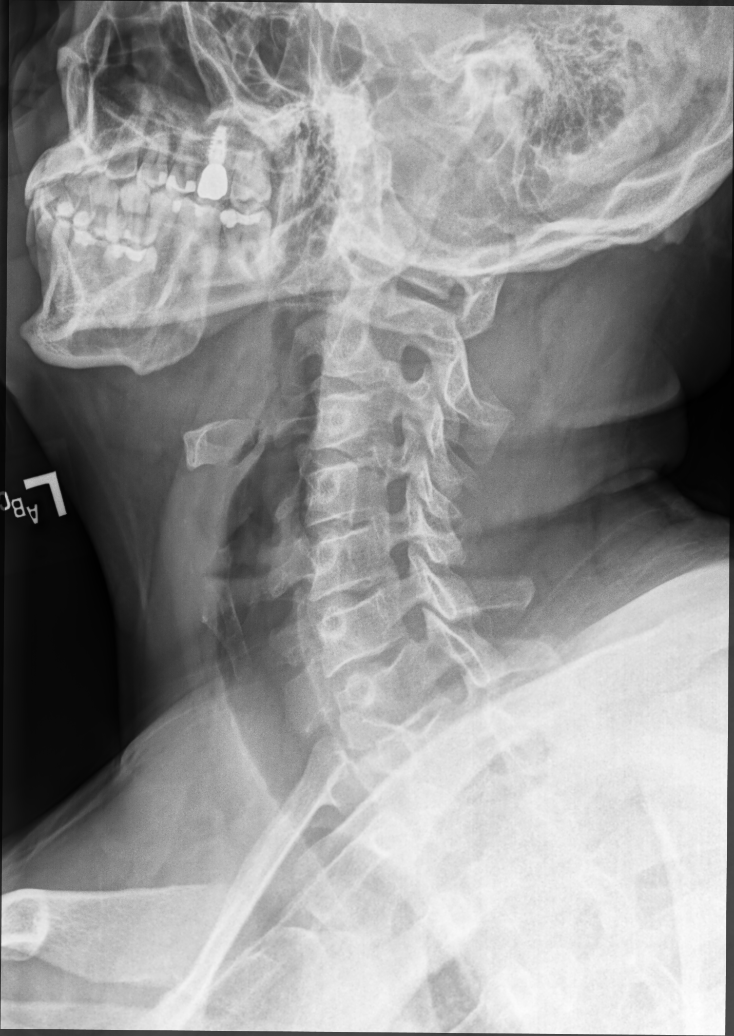

[cervical spine ap (1 of 2)]
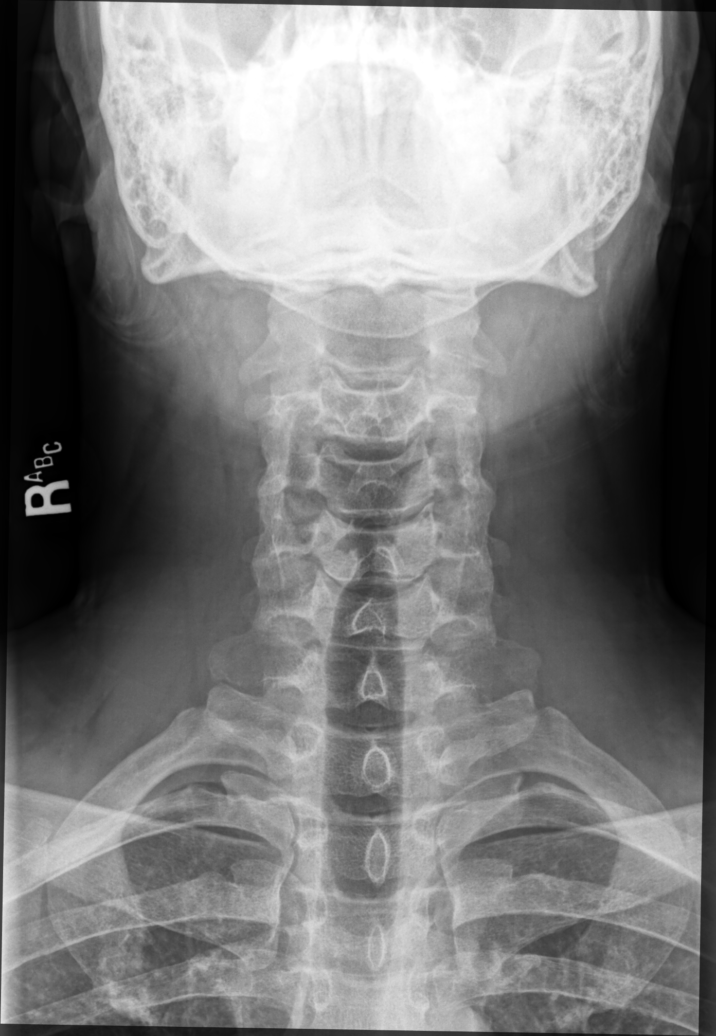

[cervical spine ap (2 of 2)]
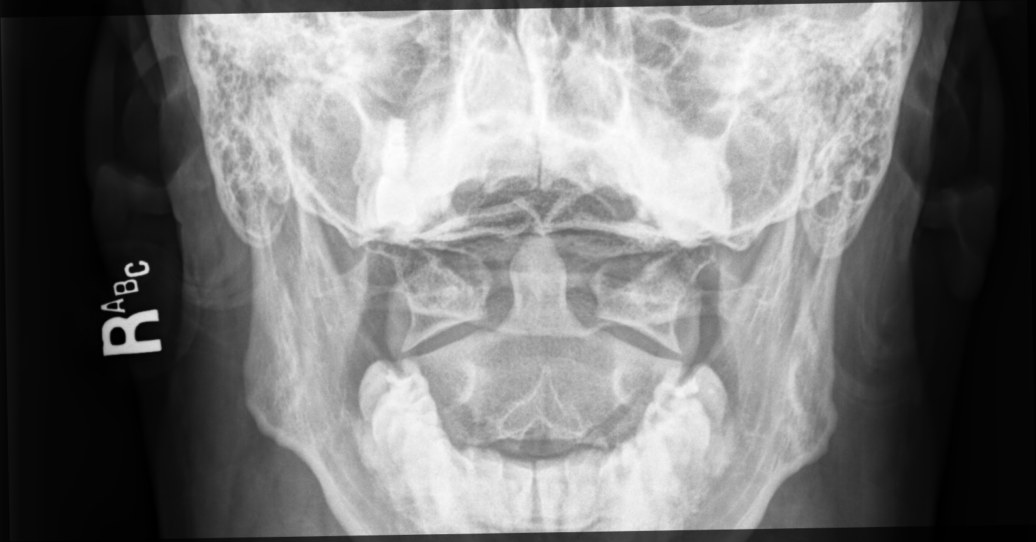

[cervical spine lat (2 of 2)]
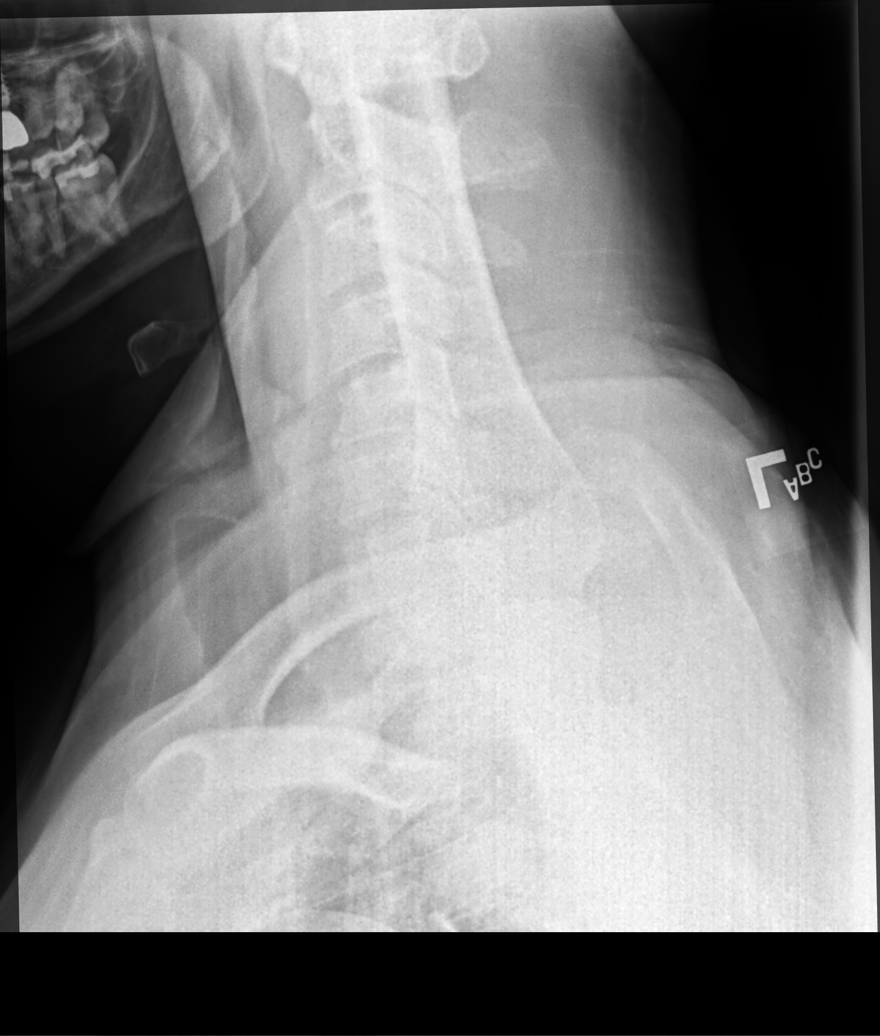

[6 of 6 positions shown; findings below may reference images not displayed]

FINDINGS: Frontal, lateral, open-mouth odontoid, and bilateral oblique views
were obtained. There is no fracture or spondylolisthesis.
Prevertebral soft tissues and predental space regions are normal.
The disc spaces appear unremarkable. There is slight facet
hypertrophy at C3-4 bilaterally, at C4-5 on the right, and at C5-6
on the left. No erosive change. Lung apices are clear.
IMPRESSION: Mild facet hypertrophy at several levels. No appreciable disc space
narrowing. No fracture or spondylolisthesis.

## 2022-05-26 ENCOUNTER — Encounter: Payer: Self-pay | Admitting: Physician Assistant

## 2022-05-26 ENCOUNTER — Ambulatory Visit (INDEPENDENT_AMBULATORY_CARE_PROVIDER_SITE_OTHER): Payer: 59 | Admitting: Physician Assistant

## 2022-05-26 VITALS — BP 110/78 | HR 60 | Temp 97.9°F | Ht 73.0 in | Wt 244.0 lb

## 2022-05-26 DIAGNOSIS — Z13220 Encounter for screening for lipoid disorders: Secondary | ICD-10-CM

## 2022-05-26 DIAGNOSIS — Z0000 Encounter for general adult medical examination without abnormal findings: Secondary | ICD-10-CM | POA: Diagnosis not present

## 2022-05-26 DIAGNOSIS — Z1159 Encounter for screening for other viral diseases: Secondary | ICD-10-CM | POA: Diagnosis not present

## 2022-05-26 DIAGNOSIS — E669 Obesity, unspecified: Secondary | ICD-10-CM

## 2022-05-26 DIAGNOSIS — Z136 Encounter for screening for cardiovascular disorders: Secondary | ICD-10-CM | POA: Diagnosis not present

## 2022-05-26 DIAGNOSIS — R29818 Other symptoms and signs involving the nervous system: Secondary | ICD-10-CM

## 2022-05-26 DIAGNOSIS — Z23 Encounter for immunization: Secondary | ICD-10-CM

## 2022-05-26 LAB — COMPREHENSIVE METABOLIC PANEL
ALT: 33 U/L (ref 0–53)
AST: 19 U/L (ref 0–37)
Albumin: 4.6 g/dL (ref 3.5–5.2)
Alkaline Phosphatase: 65 U/L (ref 39–117)
BUN: 15 mg/dL (ref 6–23)
CO2: 28 mEq/L (ref 19–32)
Calcium: 9.4 mg/dL (ref 8.4–10.5)
Chloride: 104 mEq/L (ref 96–112)
Creatinine, Ser: 1.03 mg/dL (ref 0.40–1.50)
GFR: 94.6 mL/min (ref >60.00)
Glucose, Bld: 97 mg/dL (ref 70–99)
Potassium: 3.7 mEq/L (ref 3.5–5.1)
Sodium: 142 mEq/L (ref 135–145)
Total Bilirubin: 1.3 mg/dL — ABNORMAL HIGH (ref 0.2–1.2)
Total Protein: 6.7 g/dL (ref 6.0–8.3)

## 2022-05-26 LAB — LIPID PANEL
Cholesterol: 180 mg/dL (ref 0–200)
HDL: 42.3 mg/dL (ref >39.00)
LDL Cholesterol: 112 mg/dL — ABNORMAL HIGH (ref 0–99)
NonHDL: 137.35
Total CHOL/HDL Ratio: 4
Triglycerides: 125 mg/dL (ref 0.0–149.0)
VLDL: 25 mg/dL (ref 0.0–40.0)

## 2022-05-26 LAB — CBC WITH DIFFERENTIAL/PLATELET
Basophils Absolute: 0 10*3/uL (ref 0.0–0.1)
Basophils Relative: 0.3 % (ref 0.0–3.0)
Eosinophils Absolute: 0.1 10*3/uL (ref 0.0–0.7)
Eosinophils Relative: 2.2 % (ref 0.0–5.0)
HCT: 47.3 % (ref 39.0–52.0)
Hemoglobin: 16 g/dL (ref 13.0–17.0)
Lymphocytes Relative: 29.3 % (ref 12.0–46.0)
Lymphs Abs: 1.9 10*3/uL (ref 0.7–4.0)
MCHC: 33.8 g/dL (ref 30.0–36.0)
MCV: 86.5 fl (ref 78.0–100.0)
Monocytes Absolute: 0.5 10*3/uL (ref 0.1–1.0)
Monocytes Relative: 7.1 % (ref 3.0–12.0)
Neutro Abs: 4 10*3/uL (ref 1.4–7.7)
Neutrophils Relative %: 61.1 % (ref 43.0–77.0)
Platelets: 228 10*3/uL (ref 150.0–400.0)
RBC: 5.47 Mil/uL (ref 4.22–5.81)
RDW: 14.3 % (ref 11.5–15.5)
WBC: 6.5 10*3/uL (ref 4.0–10.5)

## 2022-05-26 NOTE — Progress Notes (Signed)
Subjective:    Todd Key is a 36 y.o. male and is here for a comprehensive physical exam.  HPI  Health Maintenance Due  Topic Date Due   Hepatitis C Screening  Never done   Acute Concerns: Suspected sleep apnea -- snores and has daytime fatigue. Wife thinks he may have sleep apnea. He is not sure if he wants to pursue testing.  Chronic Issues: None  Health Maintenance: Immunizations -- UTD Colonoscopy -- n/a PSA -- No results found for: "PSA1", "PSA" Diet -- working on trying to eat healthy Sleep habits -- no concerns  Exercise -- getting back into exercise Weight -- Weight: 244 lb (110.7 kg)  Weight history Wt Readings from Last 10 Encounters:  05/26/22 244 lb (110.7 kg)  01/14/20 236 lb 6.4 oz (107.2 kg)  01/09/20 236 lb 8 oz (107.3 kg)  05/13/19 232 lb (105.2 kg)   Body mass index is 32.19 kg/m. Mood -- no concerns Tobacco use --  Tobacco Use: Low Risk  (05/26/2022)   Patient History    Smoking Tobacco Use: Never    Smokeless Tobacco Use: Never    Passive Exposure: Not on file    Alcohol use ---  reports current alcohol use of about 1.0 standard drink of alcohol per week.      05/26/2022    8:24 AM  Depression screen PHQ 2/9  Decreased Interest 0  Down, Depressed, Hopeless 0  PHQ - 2 Score 0     Other providers/specialists: Patient Care Team: Jarold Motto, [place] as PCP - General (Physician Assistant)   PMHx, SurgHx, SocialHx, Medications, and Allergies were reviewed in the Visit Navigator and updated as appropriate.   Past Medical History:  Diagnosis Date   History of chicken pox      Past Surgical History:  Procedure Laterality Date   wisdom teeth Bilateral 2005     Family History  Problem Relation Age of Onset   Alzheimer's disease Maternal Grandmother    Stomach cancer Maternal Grandfather    Heart failure Maternal Grandfather    Lung cancer Paternal Grandfather     Social History   Tobacco Use   Smoking status: Never    Smokeless tobacco: Never  Vaping Use   Vaping Use: Never used  Substance Use Topics   Alcohol use: Yes    Alcohol/week: 1.0 standard drink of alcohol    Types: 1 Standard drinks or equivalent per week   Drug use: Never    Review of Systems:   ROS  Objective:   Vitals:   05/26/22 0824  BP: 110/78  Pulse: 60  Temp: 97.9 F (36.6 C)  SpO2: 96%   Body mass index is 32.19 kg/m.  General Appearance:  Alert, cooperative, no distress, appears stated age  Head:  Normocephalic, without obvious abnormality, atraumatic  Eyes:  PERRL, conjunctiva/corneas clear, EOM's intact, fundi benign, both eyes       Ears:  Normal TM's and external ear canals, both ears  Nose: Nares normal, septum midline, mucosa normal, no drainage    or sinus tenderness  Throat: Lips, mucosa, and tongue normal; teeth and gums normal  Neck: Supple, symmetrical, trachea midline, no adenopathy; thyroid:  No enlargement/tenderness/nodules; no carotit bruit or JVD  Back:   Symmetric, no curvature, ROM normal, no CVA tenderness  Lungs:   Clear to auscultation bilaterally, respirations unlabored  Chest wall:  No tenderness or deformity  Heart:  Regular rate and rhythm, S1 and S2 normal, no murmur, rub  or gallop  Abdomen:   Soft, non-tender, bowel sounds active all four quadrants, no masses, no organomegaly  Extremities: Extremities normal, atraumatic, no cyanosis or edema  Prostate: Not done.   Skin: Skin color, texture, turgor normal, no rashes or lesions  Lymph nodes: Cervical, supraclavicular, and axillary nodes normal  Neurologic: CNII-XII grossly intact. Normal strength, sensation and reflexes throughout    Assessment/Plan:   Routine physical examination Today patient counseled on age appropriate routine health concerns for screening and prevention, each reviewed and up to date or declined. Immunizations reviewed and up to date or declined. Labs ordered and reviewed. Risk factors for depression reviewed and  negative. Hearing function and visual acuity are intact. ADLs screened and addressed as needed. Functional ability and level of safety reviewed and appropriate. Education, counseling and referrals performed based on assessed risks today. Patient provided with a copy of personalized plan for preventive services.  Obesity, unspecified classification, unspecified obesity type, unspecified whether serious comorbidity present Continue to work on diet and exercise as able  Need for prophylactic vaccination with combined diphtheria-tetanus-pertussis (DTP) vaccine Completed today  Encounter for screening for other viral diseases Update today  Encounter for lipid screening for cardiovascular disease Update today  Suspected sleep apnea Declines testing but will speak with wife and follow-up if interested   Patient Counseling: [x]   Nutrition: Stressed importance of moderation in sodium/caffeine intake, saturated fat and cholesterol, caloric balance, sufficient intake of fresh fruits, vegetables, and fiber.  [x]   Stressed the importance of regular exercise.   []   Substance Abuse: Discussed cessation/primary prevention of tobacco, alcohol, or other drug use; driving or other dangerous activities under the influence; availability of treatment for abuse.   [x]   Injury prevention: Discussed safety belts, safety helmets, smoke detector, smoking near bedding or upholstery.   []   Sexuality: Discussed sexually transmitted diseases, partner selection, use of condoms, avoidance of unintended pregnancy  and contraceptive alternatives.   [x]   Dental health: Discussed importance of regular tooth brushing, flossing, and dental visits.  [x]   Health maintenance and immunizations reviewed. Please refer to Health maintenance section.    , PA-C [facility]

## 2022-05-26 NOTE — Patient Instructions (Signed)
It was great to see you! ? ?Please go to the lab for blood work.  ? ?Our office will call you with your results unless you have chosen to receive results via MyChart. ? ?If your blood work is normal we will follow-up each year for physicals and as scheduled for chronic medical problems. ? ?If anything is abnormal we will treat accordingly and get you in for a follow-up. ? ?Take care, ? ?Todd Key ?  ? ? ?

## 2022-05-29 LAB — HEPATITIS C ANTIBODY: Hepatitis C Ab: NONREACTIVE

## 2022-08-07 ENCOUNTER — Encounter: Payer: Self-pay | Admitting: *Deleted

## 2022-08-30 ENCOUNTER — Encounter: Payer: Self-pay | Admitting: Physician Assistant

## 2022-08-30 ENCOUNTER — Ambulatory Visit: Payer: 59 | Admitting: Physician Assistant

## 2022-08-30 VITALS — BP 118/80 | HR 69 | Temp 97.8°F | Ht 73.0 in | Wt 242.5 lb

## 2022-08-30 DIAGNOSIS — R229 Localized swelling, mass and lump, unspecified: Secondary | ICD-10-CM | POA: Diagnosis not present

## 2022-08-30 DIAGNOSIS — B029 Zoster without complications: Secondary | ICD-10-CM | POA: Diagnosis not present

## 2022-08-30 MED ORDER — VALACYCLOVIR HCL 1 G PO TABS: 1000.0000 mg | ORAL_TABLET | Freq: Three times a day (TID) | ORAL | 0 refills | Status: AC

## 2022-08-30 NOTE — Patient Instructions (Signed)
It was great to see you!  Start valtrex -- for your suspected shingles.  I suspect a lipoma -- please let me know if the area increases in size.

## 2022-08-30 NOTE — Progress Notes (Signed)
Todd Key is a 36 y.o. male here for a new problem.  History of Present Illness:   Chief Complaint  Patient presents with   Neck Pain    Pt c/o left side neck pain started few days ago, now he is having pain left side of face, and lower left side of scalp, shooting pain x 1. Denies fever or chills.   Cyst    Pt has a cyst/lump right arm near elbow, has increased in size not sure how long there.    HPI  Rash He reports a dull ache on the left side of his neck that started a few days ago. Has since developed hypersensitivity to the scalp behind left ear.  Pain is mild. Only 1 episode of shooting pain, otherwise very tolerable. Denies any visual disturbances, fever, or chills.  Lipoma Reports lump on right forearm near elbow. Unsure of onset but states that he has had it for a long time. No pain or rapid increase in size. His wife is a Marine scientist and feels like he likely has a lipoma.  Past Medical History:  Diagnosis Date   History of chicken pox      Social History   Tobacco Use   Smoking status: Never   Smokeless tobacco: Never  Vaping Use   Vaping Use: Never used  Substance Use Topics   Alcohol use: Yes    Alcohol/week: 1.0 standard drink of alcohol    Types: 1 Standard drinks or equivalent per week   Drug use: Never    Past Surgical History:  Procedure Laterality Date   wisdom teeth Bilateral 2005    Family History  Problem Relation Age of Onset   Alzheimer's disease Maternal Grandmother    Stomach cancer Maternal Grandfather    Heart failure Maternal Grandfather    Lung cancer Paternal Grandfather     Allergies  Allergen Reactions   Oxycodone Nausea And Vomiting   Penicillins Rash    Current Medications:   Current Outpatient Medications:    Multiple Vitamin (MULTIVITAMIN) tablet, Take 1 tablet by mouth daily., Disp: , Rfl:    Psyllium (METAMUCIL) 28.3 % POWD, Take 1 Scoop by mouth daily., Disp: , Rfl:    valACYclovir (VALTREX) 1000 MG tablet, Take  1 tablet (1,000 mg total) by mouth 3 (three) times daily for 7 days., Disp: 21 tablet, Rfl: 0   Review of Systems:   Review of Systems  Constitutional:  Negative for chills, fever, malaise/fatigue and weight loss.  HENT:  Negative for hearing loss, sinus pain and sore throat.   Eyes:  Negative for blurred vision, double vision, photophobia and pain.  Respiratory:  Negative for cough and hemoptysis.   Cardiovascular:  Negative for chest pain, palpitations, leg swelling and PND.  Gastrointestinal:  Negative for abdominal pain, constipation, diarrhea, heartburn, nausea and vomiting.  Genitourinary:  Negative for dysuria, frequency and urgency.  Musculoskeletal:  Positive for myalgias. Negative for back pain.  Skin:  Positive for rash. Negative for itching.  Neurological:  Negative for dizziness, tingling, seizures and headaches.  Endo/Heme/Allergies:  Negative for polydipsia.  Psychiatric/Behavioral:  Negative for depression. The patient is not nervous/anxious.     Vitals:   Vitals:   08/30/22 1003  BP: 118/80  Pulse: 69  Temp: 97.8 F (36.6 C)  TempSrc: Temporal  SpO2: 95%  Weight: 242 lb 8 oz (110 kg)  Height: 6\' 1"  (1.854 m)     Body mass index is 31.99 kg/m.  Physical Exam:  Physical Exam Vitals and nursing note reviewed.  Constitutional:      General: He is not in acute distress.    Appearance: He is well-developed. He is not ill-appearing or toxic-appearing.  Cardiovascular:     Rate and Rhythm: Normal rate and regular rhythm.     Pulses: Normal pulses.     Heart sounds: Normal heart sounds, S1 normal and S2 normal.  Pulmonary:     Effort: Pulmonary effort is normal.     Breath sounds: Normal breath sounds.  Musculoskeletal:     Comments: Right posterior proximal forearm with small nodule without tenderness  Skin:    General: Skin is warm and dry.     Comments: Scattered erythematous patches to left upper chest, left neck and left side of scalp No lesions in  the ear or on nose No area of erythema or blistering noticed around eye  Neurological:     Mental Status: He is alert.     GCS: GCS eye subscore is 4. GCS verbal subscore is 5. GCS motor subscore is 6.  Psychiatric:        Speech: Speech normal.        Behavior: Behavior normal. Behavior is cooperative.     Assessment and Plan:   Herpes zoster without complication High suspicion for shingles Start Valtrex 1 g 3 times daily x7 days Declines any medication for pain relief Handout provided Follow-up with any worsening symptoms Discussed that if he develops any issues with vision or rash around the eye or nose, needs prompt attention  Skin mass Suspect lipoma Continue to monitor area and if it becomes larger or painful, will proceed with imaging  I,Alexis Herring,acting as a scribe for Jarold Motto, PA.,have documented all relevant documentation on the behalf of Jarold Motto, PA,as directed by  Jarold Motto, PA while in the presence of Jarold Motto, [place].  I, Jarold Motto, [place], have reviewed all documentation for this visit. The documentation on 08/30/22 for the exam, diagnosis, procedures, and orders are all accurate and complete.  Jarold Motto PA-C

## 2024-06-13 ENCOUNTER — Other Ambulatory Visit (HOSPITAL_BASED_OUTPATIENT_CLINIC_OR_DEPARTMENT_OTHER): Payer: Self-pay | Admitting: Family Medicine

## 2024-06-13 DIAGNOSIS — Z8249 Family history of ischemic heart disease and other diseases of the circulatory system: Secondary | ICD-10-CM

## 2024-06-17 ENCOUNTER — Ambulatory Visit (HOSPITAL_BASED_OUTPATIENT_CLINIC_OR_DEPARTMENT_OTHER)
Admission: RE | Admit: 2024-06-17 | Discharge: 2024-06-17 | Disposition: A | Discharge status: Home / Self Care | Payer: Self-pay | Source: Ambulatory Visit | Attending: Family Medicine | Admitting: Family Medicine

## 2024-06-17 DIAGNOSIS — Z8249 Family history of ischemic heart disease and other diseases of the circulatory system: Secondary | ICD-10-CM
